# Patient Record
Sex: Female | Born: 1935 | Race: White | Hispanic: No | Smoking: Never smoker
Health system: Southern US, Community
[De-identification: ages and names within clinical notes are randomized; demographics above are authoritative.]

## PROBLEM LIST (undated history)

## (undated) DIAGNOSIS — K219 Gastro-esophageal reflux disease without esophagitis: Secondary | ICD-10-CM

## (undated) HISTORY — PX: CHOLECYSTECTOMY: SHX55

## (undated) HISTORY — PX: TUBAL LIGATION: SHX77

## (undated) HISTORY — PX: HERNIA REPAIR: SHX51

---

## 2015-12-02 ENCOUNTER — Emergency Department (HOSPITAL_BASED_OUTPATIENT_CLINIC_OR_DEPARTMENT_OTHER): Payer: Medicare HMO

## 2015-12-02 ENCOUNTER — Encounter (HOSPITAL_BASED_OUTPATIENT_CLINIC_OR_DEPARTMENT_OTHER): Payer: Self-pay | Admitting: *Deleted

## 2015-12-02 ENCOUNTER — Emergency Department (HOSPITAL_BASED_OUTPATIENT_CLINIC_OR_DEPARTMENT_OTHER)
Admission: EM | Admit: 2015-12-02 | Discharge: 2015-12-02 | Disposition: A | Discharge status: Home / Self Care | Payer: Medicare HMO | Attending: Emergency Medicine | Admitting: Emergency Medicine

## 2015-12-02 DIAGNOSIS — M7122 Synovial cyst of popliteal space [Baker], left knee: Secondary | ICD-10-CM | POA: Diagnosis not present

## 2015-12-02 DIAGNOSIS — Z79899 Other long term (current) drug therapy: Secondary | ICD-10-CM | POA: Diagnosis not present

## 2015-12-02 DIAGNOSIS — M7989 Other specified soft tissue disorders: Secondary | ICD-10-CM | POA: Diagnosis present

## 2015-12-02 HISTORY — DX: Gastro-esophageal reflux disease without esophagitis: K21.9

## 2015-12-02 NOTE — ED Notes (Signed)
Pt c/o lower leg/ knee pain and swelling x 3 days

## 2015-12-02 NOTE — ED Notes (Signed)
PA-C at bedside 

## 2015-12-02 NOTE — Discharge Instructions (Signed)
May take tylenol or motrin if you knee starts bothering you. Follow-up with your primary care doctor. Return to the ED for new or worsening symptoms.  Baker Cyst A Baker cyst is a sac-like structure that forms in the back of the knee. It is filled with the same fluid that is located in your knee. This fluid lubricates the bones and cartilage of the knee and allows them to move over each other more easily. CAUSES  When the knee becomes injured or inflamed, increased fluid forms in the knee. When this happens, the joint lining is pushed out behind the knee and forms the Baker cyst. This cyst may also be caused by inflammation from arthritic conditions and infections. SIGNS AND SYMPTOMS  A Baker cyst usually has no symptoms. When the cyst is substantially enlarged:  You may feel pressure behind the knee, stiffness in the knee, or a mass in the area behind the knee.  You may develop pain, redness, and swelling in the calf. This can suggest a blood clot and requires evaluation by your health care provider. DIAGNOSIS  A Baker cyst is most often found during an ultrasound exam. This exam may have been performed for other reasons, and the cyst was found incidentally. Sometimes an MRI is used. This picks up other problems within a joint that an ultrasound exam may not. If the Baker cyst developed immediately after an injury, X-ray exams may be used to diagnose the cyst. TREATMENT  The treatment depends on the cause of the cyst. Anti-inflammatory medicines and rest often will be prescribed. If the cyst is caused by a bacterial infection, antibiotic medicines may be prescribed.  HOME CARE INSTRUCTIONS   If the cyst was caused by an injury, for the first 24 hours, keep the injured leg elevated on 2 pillows while lying down.  For the first 24 hours while you are awake, apply ice to the injured area:  Put ice in a plastic bag.  Place a towel between your skin and the bag.  Leave the ice on for 20  minutes, 2-3 times a day.  Only take over-the-counter or prescription medicines for pain, discomfort, or fever as directed by your health care provider.  Only take antibiotic medicine as directed. Make sure to finish it even if you start to feel better. MAKE SURE YOU:   Understand these instructions.  Will watch your condition.  Will get help right away if you are not doing well or get worse.   This information is not intended to replace advice given to you by your health care provider. Make sure you discuss any questions you have with your health care provider.   Document Released: 05/30/2005 Document Revised: 03/20/2013 Document Reviewed: 01/09/2013 Elsevier Interactive Patient Education [employer]

## 2015-12-02 NOTE — ED Provider Notes (Signed)
CSN: [id number]     Arrival date & time 12/02/15  2015 History   First MD Initiated Contact with Patient 12/02/15 2030     Chief Complaint  Patient presents with  . Leg Swelling     (Consider location/radiation/quality/duration/timing/severity/associated sxs/prior Treatment) The history is provided by the patient and medical records.    80 year old female with history of GERD, presenting to the ED for left knee/leg pain for the past 3 days. She denies any injury, trauma, or falls. States there is some discomfort when up and walking, better when sitting still. She states her calf has been somewhat tender to the touch. She denies any fever or chills. No history of DVT or PE. Patient does take Estroven to help with her menopausal symptoms. She denies any chest pain or shortness of breath. She did take some Motrin earlier today.  States she is mostly here because her daughter wanted her to get checked.  VSS.  Past Medical History  Diagnosis Date  . GERD (gastroesophageal reflux disease)    Past Surgical History  Procedure Laterality Date  . Hernia repair    . Cholecystectomy    . Tubal ligation     History reviewed. No pertinent family history. Social History  Substance Use Topics  . Smoking status: Never Smoker   . Smokeless tobacco: None  . Alcohol Use: No   OB History    No data available     Review of Systems  Musculoskeletal: Positive for arthralgias.  All other systems reviewed and are negative.     Allergies  Penicillins and Sulfa antibiotics  Home Medications   Prior to Admission medications   Medication Sig Start Date End Date Taking? Authorizing Provider  Black Cohosh-SoyIsoflav-Magnol (ESTROVEN MENOPAUSE RELIEF PO) Take by mouth.   Yes Historical Provider, MD  esomeprazole (NEXIUM) 40 MG capsule Take 40 mg by mouth daily at 12 noon.   Yes Historical Provider, MD   BP 148/86 mmHg  Pulse 94  Temp(Src) 98.6 F (37 C) (Oral)  Resp 18  Ht  (1.549 m)   Wt 55.792 kg  BMI 23.25 kg/m2  SpO2 98%   Physical Exam  Constitutional: She is oriented to person, place, and time. She appears well-developed and well-nourished. No distress.  HENT:  Head: Normocephalic and atraumatic.  Mouth/Throat: Oropharynx is clear and moist.  Eyes: Conjunctivae and EOM are normal. Pupils are equal, round, and reactive to light.  Neck: Normal range of motion. Neck supple.  Cardiovascular: Normal rate, regular rhythm and normal heart sounds.   Pulmonary/Chest: Effort normal and breath sounds normal. No respiratory distress. She has no wheezes.  Abdominal: Soft. Bowel sounds are normal. There is no tenderness. There is no guarding.  Musculoskeletal: Normal range of motion. She exhibits no edema.  Left proximal calf with tenderness along the medial aspect; no significant swelling, overlying skin changes, or warmth to touch; there are varicose veins present in the left lower leg Full range of motion of the left knee without difficulty, no bony deformities, DP pulse intact, normal sensation throughout left leg  Neurological: She is alert and oriented to person, place, and time.  Skin: Skin is warm and dry. She is not diaphoretic.  Psychiatric: She has a normal mood and affect.  Nursing note and vitals reviewed.   ED Course  Procedures (including critical care time) Labs Review Labs Reviewed - No data to display  Imaging Review US Venous Img Lower Unilateral Left  12/02/2015  CLINICAL DATA:  Left  lower extremity swelling x3 days EXAM: LEFT LOWER EXTREMITY VENOUS DOPPLER ULTRASOUND TECHNIQUE: Gray-scale sonography with compression, as well as color and duplex ultrasound, were performed to evaluate the deep venous system from the level of the common femoral vein through the popliteal and proximal calf veins. COMPARISON:  None FINDINGS: Normal compressibility of the common femoral, superficial femoral, and popliteal veins, as well as the proximal calf veins. No filling  defects to suggest DVT on grayscale or color Doppler imaging. Doppler waveforms show normal direction of venous flow, normal respiratory phasicity and response to augmentation. Visualized segments of the saphenous venous system normal in caliber and compressibility. An elongated 4.4 x 0.7 cm cyst in the posterior popliteal fossa. Survey views of the contralateral common femoral vein are unremarkable. IMPRESSION: No evidence of left lower extremity deep vein thrombosis. Baker's cyst. Electronically Signed   By: Corlis Leak  Hassell M.D.   On: 12/02/2015 21:55   Dg Knee Complete 4 Views Left  12/02/2015  CLINICAL DATA:  80 year old female with left knee pain EXAM: LEFT KNEE - COMPLETE 4+ VIEW COMPARISON:  None FINDINGS: No evidence of fracture, dislocation, or joint effusion. No significant arthropathy or other focal bone abnormality. Soft tissues are unremarkable. IMPRESSION: Negative. Electronically Signed   By: Elgie CollardArash  Radparvar M.D.   On: 12/02/2015 21:19   I have personally reviewed and evaluated these images and lab results as part of my medical decision-making.   EKG Interpretation None      MDM   Final diagnoses:  Baker's cyst of knee, left   80 year old female here with left knee and proximal calf pain for the past 3 days. Patient is afebrile, nontoxic. She has full range of motion of the knee. There is no bony deformity. She does have some tenderness of the proximal calf, worse along the medial aspect. She does have some varicose veins in the left lower leg. She also is on estrogen therapy for her menopausal symptoms. X-ray of left knee is reassuring. Venous duplex without evidence of DVT, she does have a noted Baker's cyst. This is likely the source of her pain. I do not suspect septic joint. Discussed results with patient, she acknowledged understanding. She reports history of the same in the past. States she is not in any significant pain at this time, does not actually want to do anything about the  cyst. She will follow-up with her PCP who may refer her to orthopedics should she want this removed in the future.  Discussed plan with patient, he/she acknowledged understanding and agreed with plan of care.  Return precautions given for new or worsening symptoms.  Garlon HatchetLisa M Gene Glazebrook, PA-C 12/02/15 2258  Benjiman CoreNathan Pickering, MD 12/02/15 2259

## 2017-08-10 IMAGING — DX DG KNEE COMPLETE 4+V*L*
4 series · 4 of 4 positions shown · non-contrast
Comparison: None

CLINICAL DATA: 79-year-old female with left knee pain

EXAM:
LEFT KNEE - COMPLETE 4+ VIEW

[knee ap]
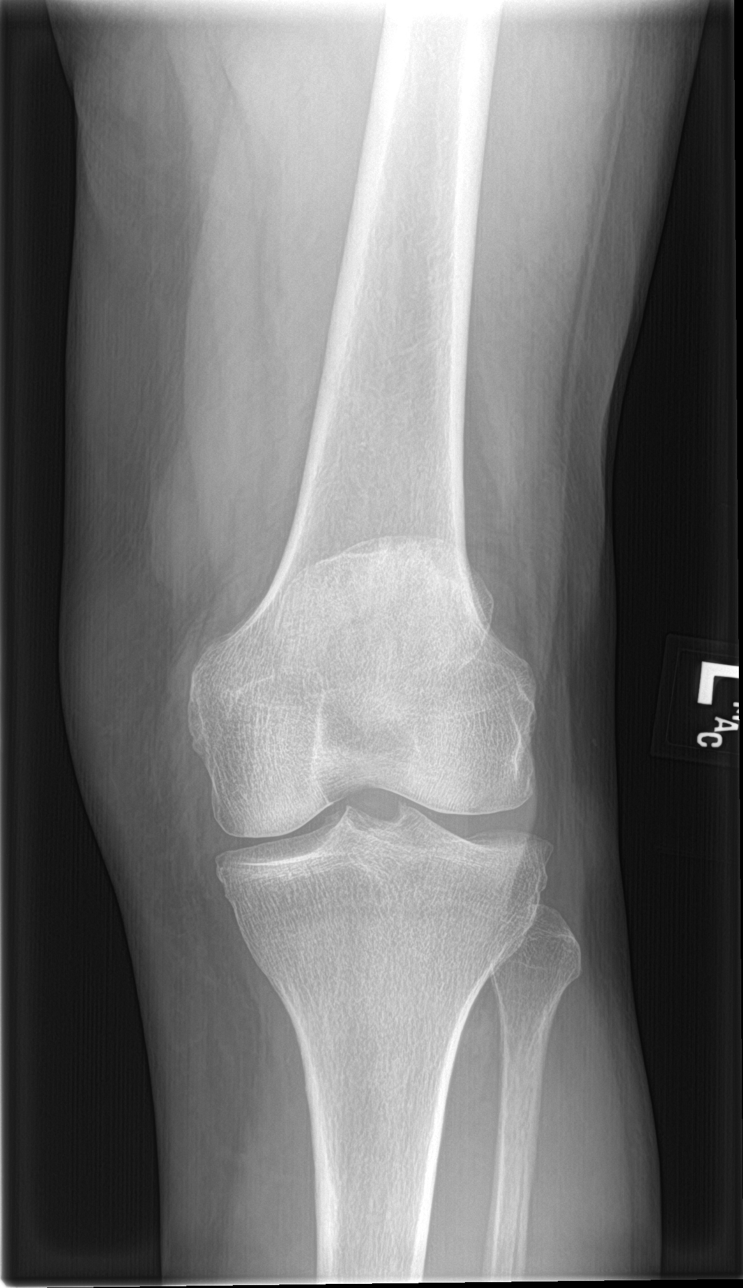

[tunnel]
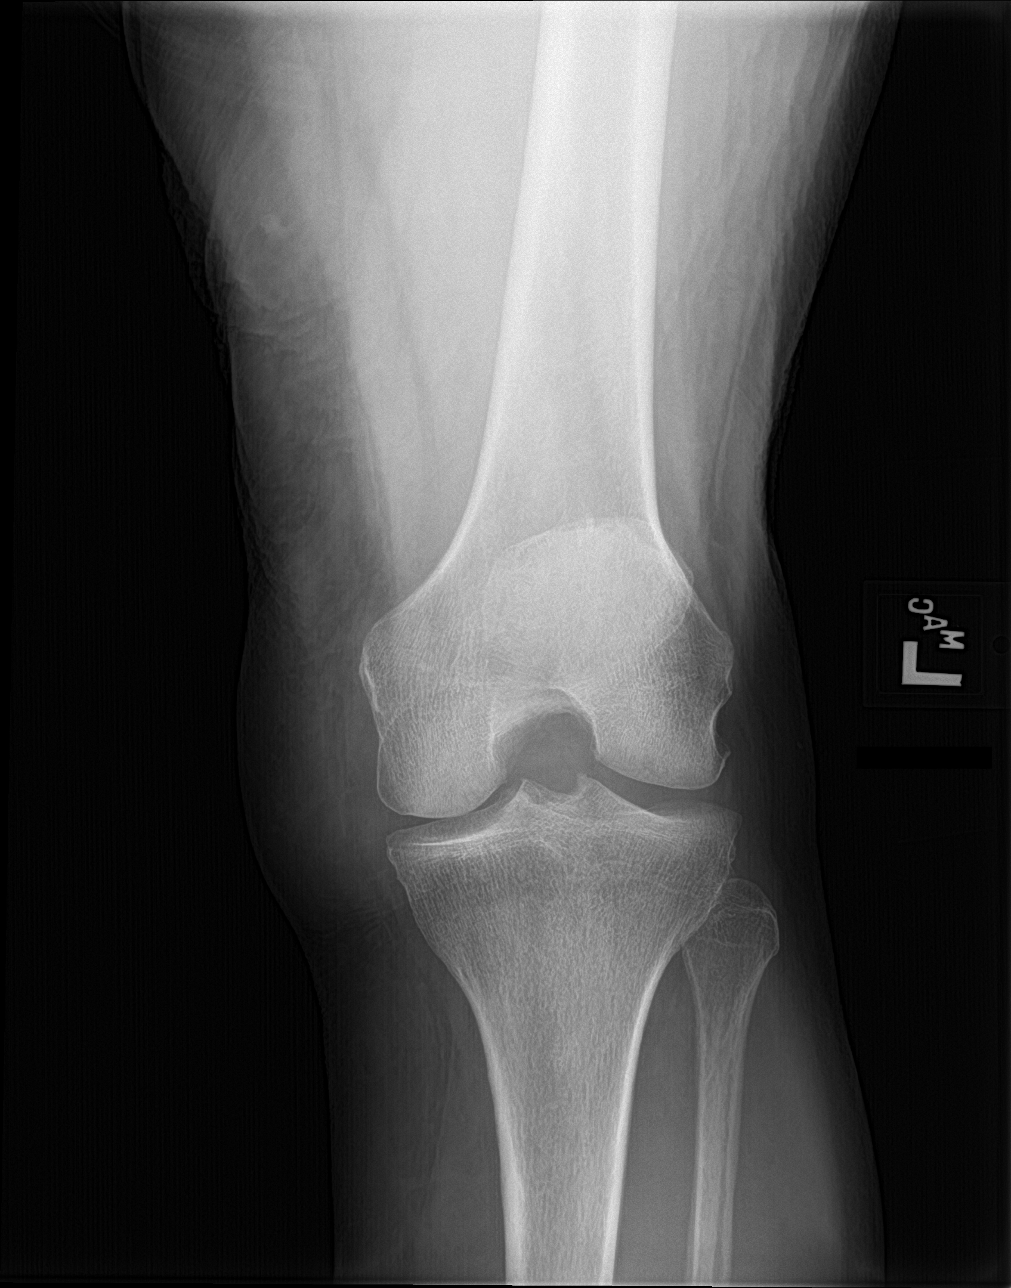

[knee lat]
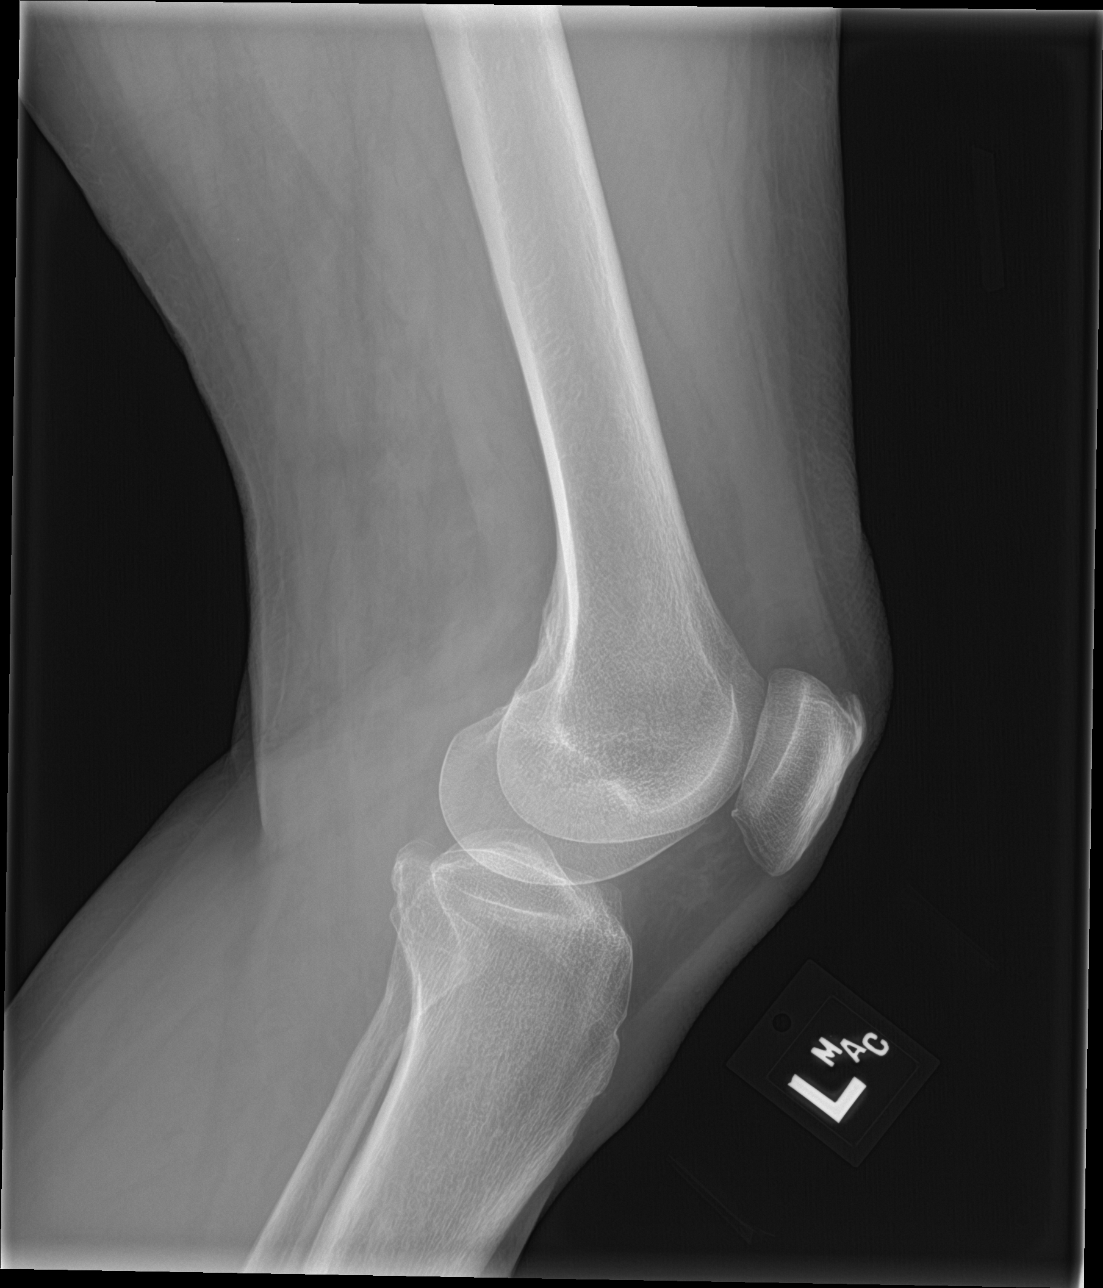

[knee sunrise]
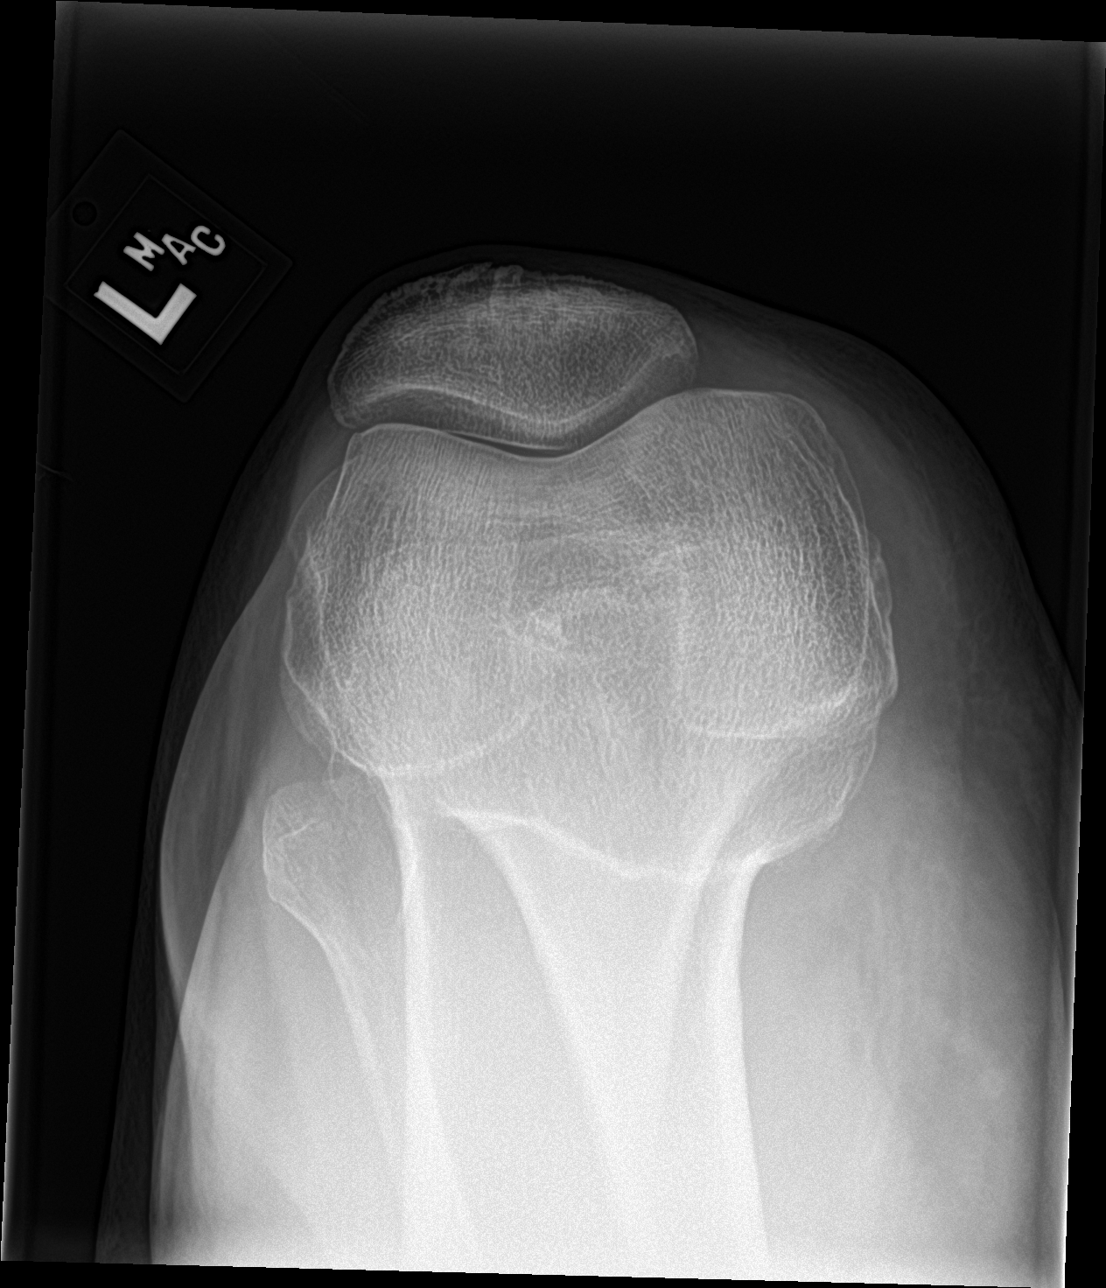

[4 of 4 positions shown; findings below may reference images not displayed]

FINDINGS: No evidence of fracture, dislocation, or joint effusion. No
significant arthropathy or other focal bone abnormality. Soft
tissues are unremarkable.
IMPRESSION: Negative.

## 2017-10-12 IMAGING — US US EXTREM LOW VENOUS*L*
1 series · 14 of 24 positions shown · non-contrast
Comparison: None

CLINICAL DATA: Left lower extremity swelling x3 days

EXAM:
LEFT LOWER EXTREMITY VENOUS DOPPLER ULTRASOUND
TECHNIQUE: Gray-scale sonography with compression, as well as color and duplex
ultrasound, were performed to evaluate the deep venous system from
the level of the common femoral vein through the popliteal and
proximal calf veins.

[Series 1: us extrem low venous*left* · 0.06mm/px · 14 of 29 slices shown]
[im 1/29]
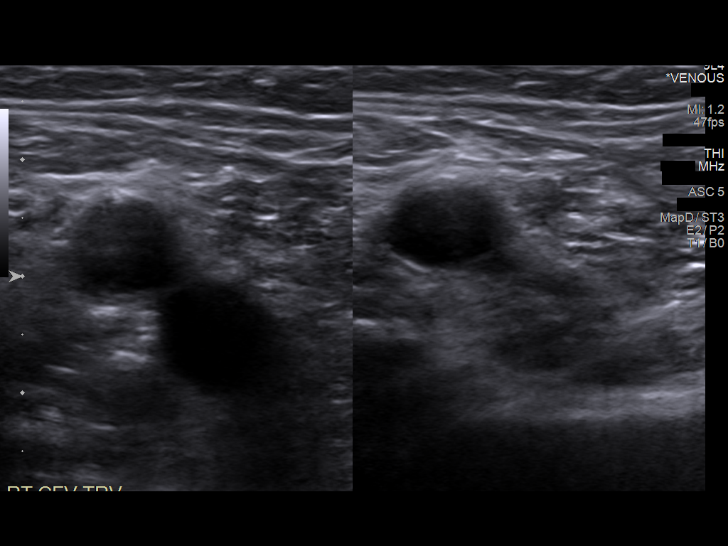
[im 3/29]
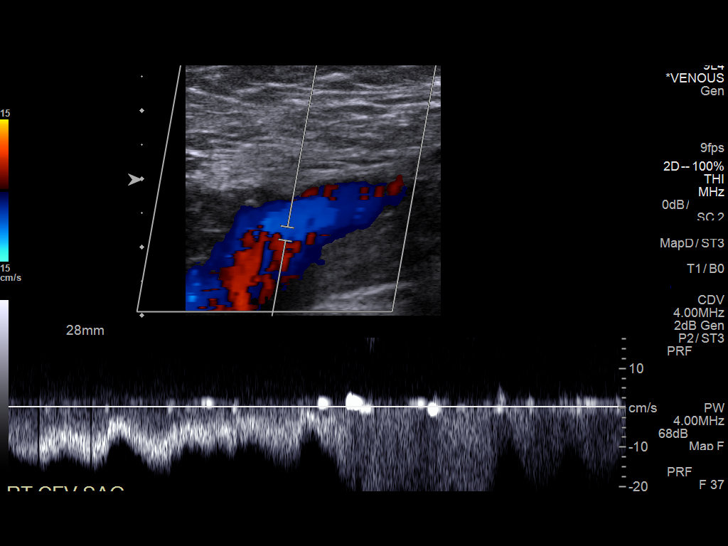
[im 5/29]
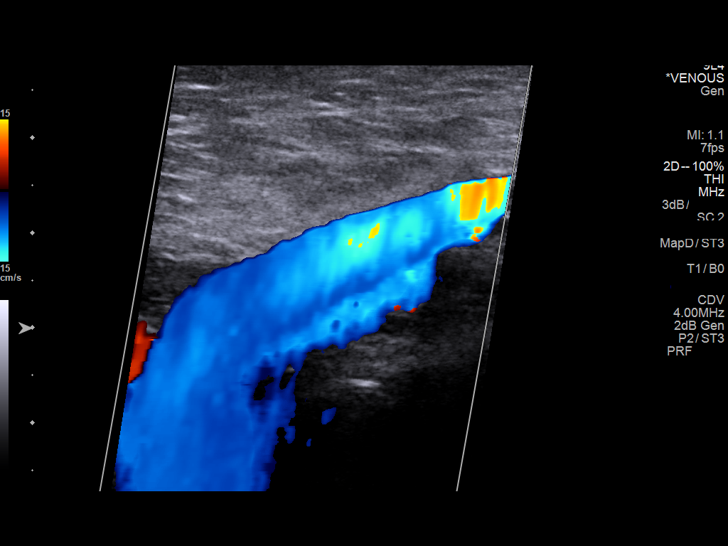
[im 8/29]
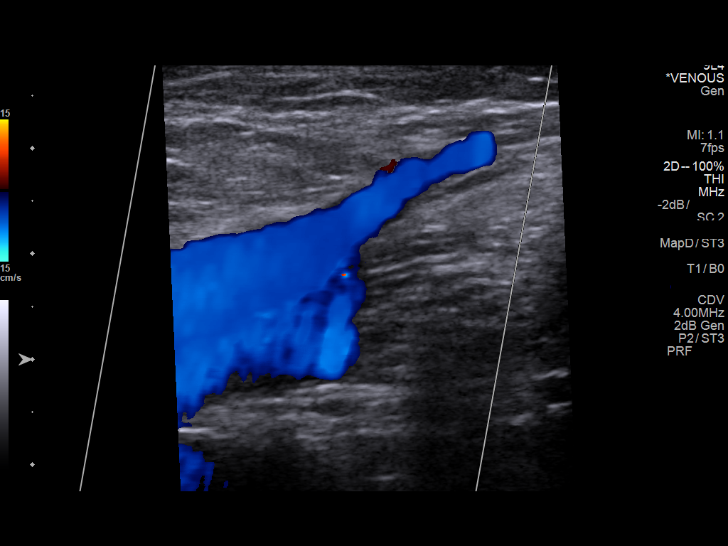
[im 9/29]
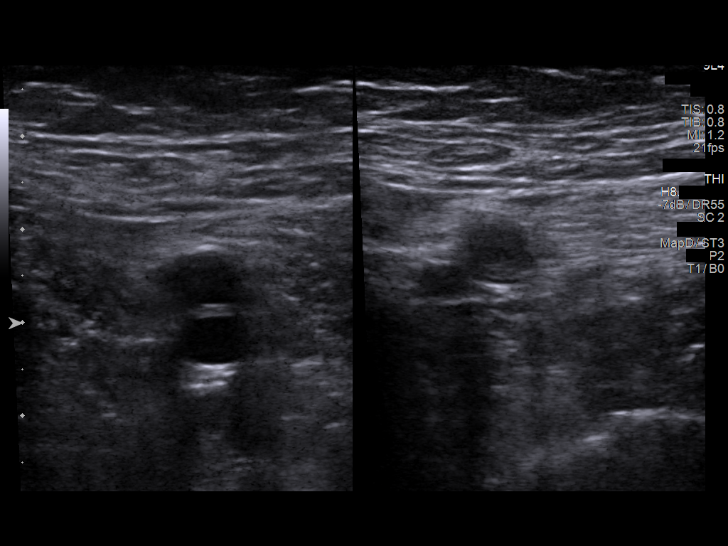
[im 11/29]
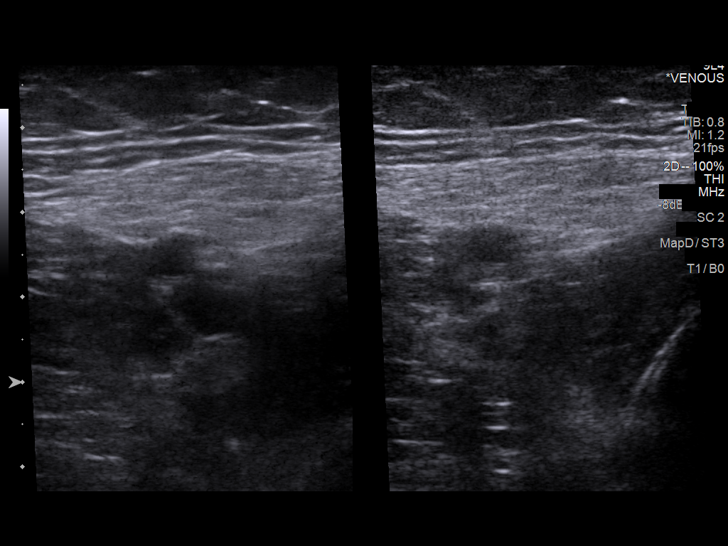
[im 14/29]
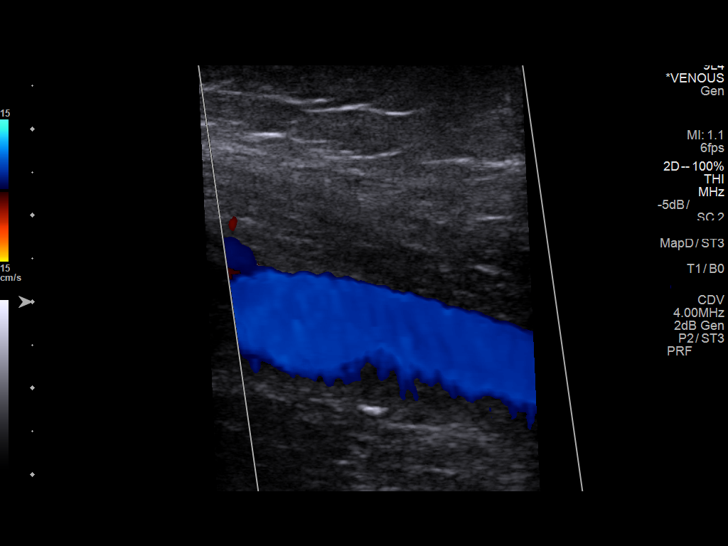
[im 15/29]
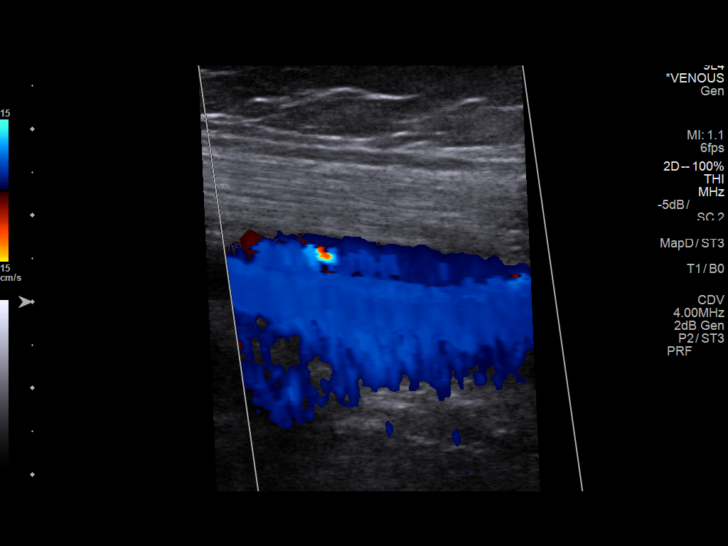
[im 18/29]
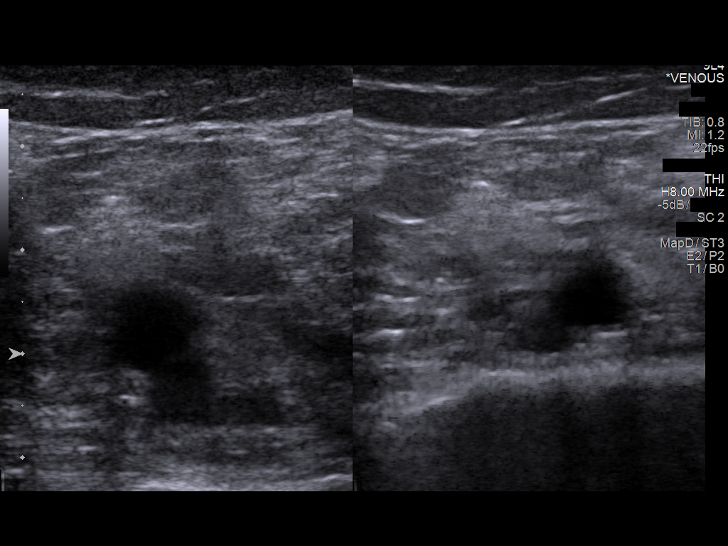
[im 20/29]
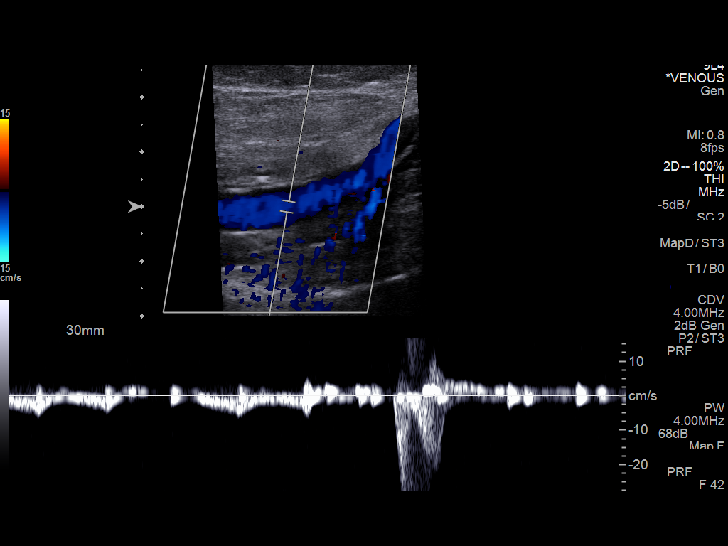
[im 22/29]
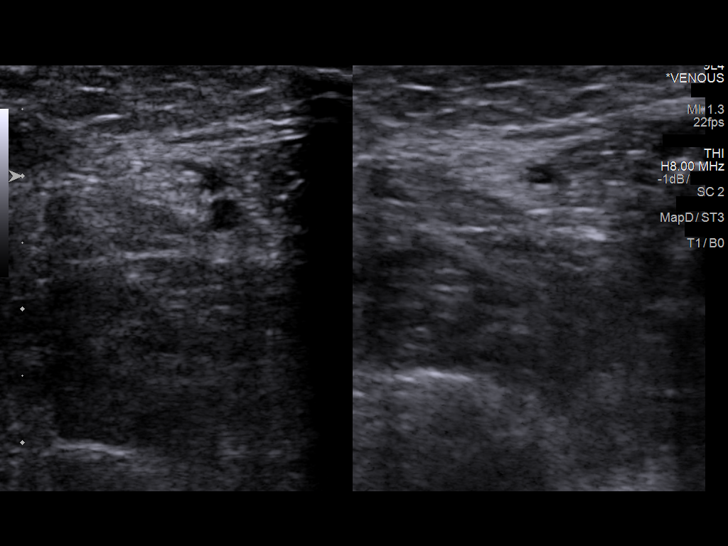
[im 24/29]
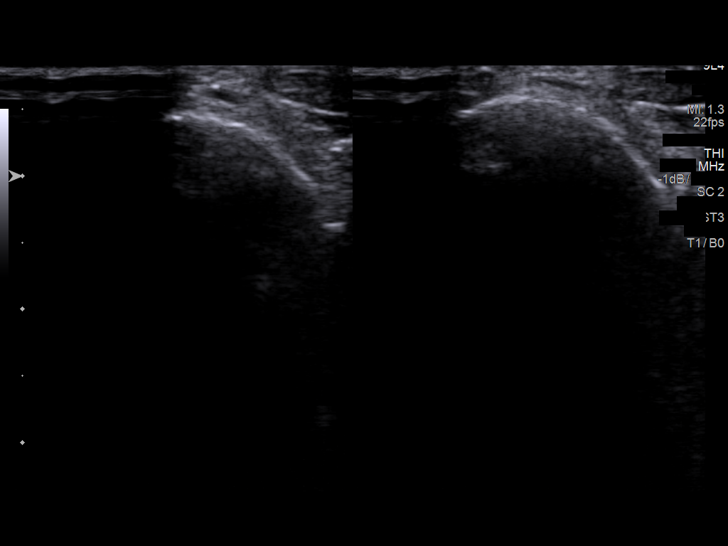
[im 26/29]
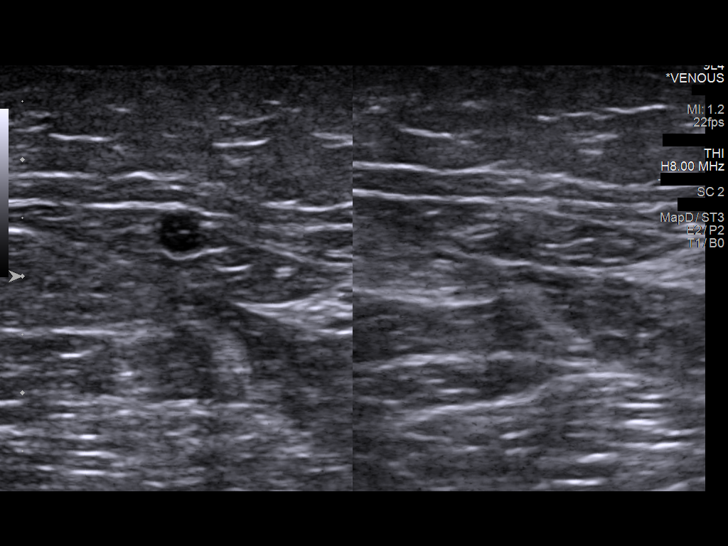
[im 29/29]
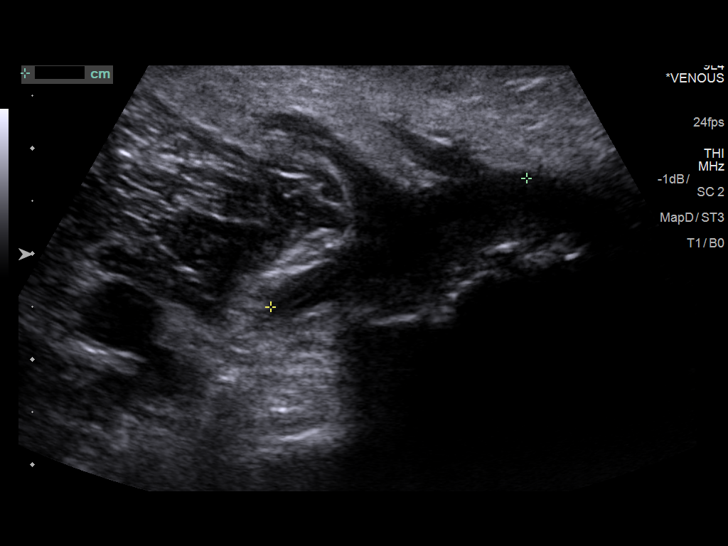

[14 of 24 positions shown; findings below may reference images not displayed]

FINDINGS: Normal compressibility of the common femoral, superficial femoral,
and popliteal veins, as well as the proximal calf veins. No filling
defects to suggest DVT on grayscale or color Doppler imaging.
Doppler waveforms show normal direction of venous flow, normal
respiratory phasicity and response to augmentation. Visualized
segments of the saphenous venous system normal in caliber and
compressibility. An elongated 4.4 x 0.7 cm cyst in the posterior
popliteal fossa. Survey views of the contralateral common femoral
vein are unremarkable.
IMPRESSION: No evidence of left lower extremity deep vein thrombosis.

Baker's cyst.

## 2021-06-26 ENCOUNTER — Emergency Department (HOSPITAL_BASED_OUTPATIENT_CLINIC_OR_DEPARTMENT_OTHER)
Admission: EM | Admit: 2021-06-26 | Discharge: 2021-06-26 | Disposition: A | Discharge status: Home / Self Care | Payer: Medicare HMO | Attending: Emergency Medicine | Admitting: Emergency Medicine

## 2021-06-26 ENCOUNTER — Other Ambulatory Visit: Payer: Self-pay

## 2021-06-26 ENCOUNTER — Encounter (HOSPITAL_BASED_OUTPATIENT_CLINIC_OR_DEPARTMENT_OTHER): Payer: Self-pay | Admitting: Emergency Medicine

## 2021-06-26 DIAGNOSIS — R7989 Other specified abnormal findings of blood chemistry: Secondary | ICD-10-CM | POA: Diagnosis not present

## 2021-06-26 DIAGNOSIS — I10 Essential (primary) hypertension: Secondary | ICD-10-CM | POA: Diagnosis not present

## 2021-06-26 DIAGNOSIS — R110 Nausea: Secondary | ICD-10-CM | POA: Insufficient documentation

## 2021-06-26 DIAGNOSIS — R42 Dizziness and giddiness: Secondary | ICD-10-CM | POA: Diagnosis present

## 2021-06-26 LAB — BASIC METABOLIC PANEL
Anion gap: 9 (ref 5–15)
BUN: 17 mg/dL (ref 8–23)
CO2: 24 mmol/L (ref 22–32)
Calcium: 9.2 mg/dL (ref 8.9–10.3)
Chloride: 105 mmol/L (ref 98–111)
Creatinine, Ser: 1.22 mg/dL — ABNORMAL HIGH (ref 0.44–1.00)
GFR, Estimated: 44 mL/min — ABNORMAL LOW (ref >60)
Glucose, Bld: 105 mg/dL — ABNORMAL HIGH (ref 70–99)
Potassium: 4.3 mmol/L (ref 3.5–5.1)
Sodium: 138 mmol/L (ref 135–145)

## 2021-06-26 LAB — CBC WITH DIFFERENTIAL/PLATELET
Abs Immature Granulocytes: 0.02 10*3/uL (ref 0.00–0.07)
Basophils Absolute: 0.1 10*3/uL (ref 0.0–0.1)
Basophils Relative: 1 %
Eosinophils Absolute: 0.2 10*3/uL (ref 0.0–0.5)
Eosinophils Relative: 2 %
HCT: 37.9 % (ref 36.0–46.0)
Hemoglobin: 12.8 g/dL (ref 12.0–15.0)
Immature Granulocytes: 0 %
Lymphocytes Relative: 28 %
Lymphs Abs: 2.5 10*3/uL (ref 0.7–4.0)
MCH: 29.5 pg (ref 26.0–34.0)
MCHC: 33.8 g/dL (ref 30.0–36.0)
MCV: 87.3 fL (ref 80.0–100.0)
Monocytes Absolute: 0.8 10*3/uL (ref 0.1–1.0)
Monocytes Relative: 9 %
Neutro Abs: 5.6 10*3/uL (ref 1.7–7.7)
Neutrophils Relative %: 60 %
Platelets: 234 10*3/uL (ref 150–400)
RBC: 4.34 MIL/uL (ref 3.87–5.11)
RDW: 13.2 % (ref 11.5–15.5)
WBC: 9.2 10*3/uL (ref 4.0–10.5)
nRBC: 0 % (ref 0.0–0.2)

## 2021-06-26 LAB — URINALYSIS, ROUTINE W REFLEX MICROSCOPIC
Bilirubin Urine: NEGATIVE
Glucose, UA: NEGATIVE mg/dL
Ketones, ur: NEGATIVE mg/dL
Leukocytes,Ua: NEGATIVE
Nitrite: NEGATIVE
Protein, ur: NEGATIVE mg/dL
Specific Gravity, Urine: 1.02 (ref 1.005–1.030)
pH: 7 (ref 5.0–8.0)

## 2021-06-26 LAB — URINALYSIS, MICROSCOPIC (REFLEX)

## 2021-06-26 LAB — TROPONIN I (HIGH SENSITIVITY): Troponin I (High Sensitivity): 3 ng/L (ref <18)

## 2021-06-26 MED ORDER — SODIUM CHLORIDE 0.9 % IV BOLUS
500.0000 mL | Freq: Once | INTRAVENOUS | Status: AC
  Administered 2021-06-26: 500 mL via INTRAVENOUS

## 2021-06-26 MED ORDER — ONDANSETRON HCL 4 MG/2ML IJ SOLN
4.0000 mg | Freq: Once | INTRAMUSCULAR | Status: AC
  Administered 2021-06-26: 4 mg via INTRAVENOUS
  Filled 2021-06-26: qty 2

## 2021-06-26 MED ORDER — ONDANSETRON HCL 4 MG PO TABS: 4.0000 mg | ORAL_TABLET | Freq: Three times a day (TID) | ORAL | 0 refills | Status: AC | PRN

## 2021-06-26 NOTE — ED Triage Notes (Addendum)
Pt reports elevated BP at home 170/87; reports nausea and dizziness x 2wks

## 2021-06-26 NOTE — ED Provider Notes (Signed)
[facility] Provider Note   CSN: [id number] Arrival date & time: 06/26/21  1811     History  Chief Complaint  Patient presents with   Hypertension    Cindy Shelton is a 86 y.o. female.  She said her blood pressure was elevated last week.  She attributed it to cold medication she was taking from some nasal congestion.  She has been on and off lightheaded/dizzy especially when standing up quickly.  Today she has been having nausea and checked her blood pressure and found it to be elevated.  She denies any cough chest pain headache blurry vision double vision numbness weakness.  No abdominal pain vomiting diarrhea constipation or urinary symptoms.  She has tried nothing for her symptoms.  The history is provided by the patient.  Hypertension This is a new problem. The current episode started 6 to 12 hours ago. The problem occurs constantly. The problem has not changed since onset.Pertinent negatives include no chest pain, no abdominal pain, no headaches and no shortness of breath. Nothing aggravates the symptoms. Nothing relieves the symptoms. She has tried nothing for the symptoms. The treatment provided no relief.      Home Medications Prior to Admission medications   Medication Sig Start Date End Date Taking? Authorizing Provider  Black Cohosh-SoyIsoflav-Magnol (ESTROVEN MENOPAUSE RELIEF PO) Take by mouth.    [provider]  esomeprazole (NEXIUM) 40 MG capsule Take 40 mg by mouth daily at 12 noon.    [provider]      Allergies    Penicillins and Sulfa antibiotics    Review of Systems   Review of Systems  Constitutional:  Negative for fever.  HENT:  Negative for sore throat.   Eyes:  Negative for visual disturbance.  Respiratory:  Negative for shortness of breath.   Cardiovascular:  Negative for chest pain.  Gastrointestinal:  Positive for nausea. Negative for abdominal pain, diarrhea and vomiting.  Genitourinary:   Negative for dysuria.  Musculoskeletal:  Negative for neck pain.  Skin:  Negative for rash.  Neurological:  Positive for light-headedness. Negative for headaches.   Physical Exam Updated Vital Signs BP (!) 186/89    Pulse 90    Temp 98.2 F (36.8 C) (Oral)    Resp 18    Ht 5\' 2"  (1.575 m)    Wt 54.9 kg    SpO2 100%    BMI 22.13 kg/m  Physical Exam Vitals and nursing note reviewed.  Constitutional:      General: She is not in acute distress.    Appearance: Normal appearance. She is well-developed.  HENT:     Head: Normocephalic and atraumatic.  Eyes:     Conjunctiva/sclera: Conjunctivae normal.  Cardiovascular:     Rate and Rhythm: Normal rate and regular rhythm.     Heart sounds: No murmur heard. Pulmonary:     Effort: Pulmonary effort is normal. No respiratory distress.     Breath sounds: Normal breath sounds.  Abdominal:     Palpations: Abdomen is soft.     Tenderness: There is no abdominal tenderness.  Musculoskeletal:        General: No swelling.     Cervical back: Neck supple.  Skin:    General: Skin is warm and dry.     Capillary Refill: Capillary refill takes less than 2 seconds.  Neurological:     General: No focal deficit present.     Mental Status: She is alert and oriented to person, place,  and time.     Cranial Nerves: No cranial nerve deficit.     Sensory: No sensory deficit.     Motor: No weakness.     Gait: Gait normal.  Psychiatric:        Mood and Affect: Mood normal.    ED Results / Procedures / Treatments   Labs (all labs ordered are listed, but only abnormal results are displayed) Labs Reviewed  BASIC METABOLIC PANEL - Abnormal; Notable for the following components:      Result Value   Glucose, Bld 105 (*)    Creatinine, Ser 1.22 (*)    GFR, Estimated 44 (*)    All other components within normal limits  URINALYSIS, ROUTINE W REFLEX MICROSCOPIC - Abnormal; Notable for the following components:   Color, Urine STRAW (*)    Hgb urine dipstick  MODERATE (*)    All other components within normal limits  URINALYSIS, MICROSCOPIC (REFLEX) - Abnormal; Notable for the following components:   Bacteria, UA RARE (*)    All other components within normal limits  CBC WITH DIFFERENTIAL/PLATELET  TROPONIN I (HIGH SENSITIVITY)    EKG EKG Interpretation  Date/Time:  Saturday June 26 2021 18:23:21 EST Ventricular Rate:  92 PR Interval:  166 QRS Duration: 64 QT Interval:  366 QTC Calculation: 452 R Axis:   61 Text Interpretation: Normal sinus rhythm Normal ECG No previous ECGs available Confirmed by Aletta Edouard [phone]) on 06/26/2021 6:25:14 PM  Radiology No results found.  Procedures Procedures    Medications Ordered in ED Medications  ondansetron (ZOFRAN) injection 4 mg (4 mg Intravenous Given 06/26/21 1926)  sodium chloride 0.9 % bolus 500 mL (0 mLs Intravenous Stopped 06/26/21 2040)    ED Course/ Medical Decision Making/ A&P Clinical Course as of 06/27/21 1006  Sat Jun 26, 2021  2006 Patient has a little bit of blood in her urine.  On review of prior lab work [facility] this has been seen before.  Creatinine also stable from priors. [MB]  2018 Patient feeling better after IV fluids.  Blood pressure is also come down a little bit. [MB]    Clinical Course User Index [MB] Hayden Rasmussen, MD                           Medical Decision Making This patient complains of his lightheadedness, nausea, elevated blood pressure; this involves an extensive number of treatment Options and is a complaint that carries with it a high risk of complications and Morbidity. The differential includes hypertension, hypertensive emergency, vertigo, dehydration, metabolic derangement, stroke  I ordered, reviewed and interpreted labs, which included CBC with normal white count normal hemoglobin, chemistries normal other than mildly elevated creatinine stable from priors, urinalysis without clear signs of infection I ordered medication IV  fluids and Zofran with improvement of patient's symptoms  Additional history obtained from patient's spouse Previous records obtained and reviewed in epic no recent ED visits  After the interventions stated above, I reevaluated the patient and found patient symptoms to be improved.  Recommended hydration and close follow-up with her PCP.  Return instructions discussed          Final Clinical Impression(s) / ED Diagnoses Final diagnoses:  Primary hypertension  Dizziness  Nausea    Rx / DC Orders ED Discharge Orders          Ordered    ondansetron (ZOFRAN) 4 MG tablet  Every 8 hours PRN  06/26/21 2018              Hayden Rasmussen, MD 06/27/21 1008

## 2021-06-26 NOTE — ED Notes (Signed)
Patient discharged to home.  All discharge instructions reviewed.  Patient verbalized understanding via teachback method.  VS WDL.  Respirations even and unlabored.  Ambulatory out of ED.   °

## 2021-06-26 NOTE — Discharge Instructions (Addendum)
You were seen in the emergency department for on and off dizziness along with nausea.  Your blood pressure was elevated today.  You had lab work done along with EKG and a urinalysis that did not show an obvious explanation of your symptoms.  We are prescribing some nausea medication.  Please check your blood pressure and record it and follow-up with your primary care doctor.  Return to the emergency department if any worsening or concerning symptoms.
